# Patient Record
Sex: Female | Born: 1970 | Race: White | Hispanic: No | Smoking: Never smoker
Health system: Southern US, Community
[De-identification: ages and names within clinical notes are randomized; demographics above are authoritative.]

## PROBLEM LIST (undated history)

## (undated) DIAGNOSIS — J45909 Unspecified asthma, uncomplicated: Secondary | ICD-10-CM

---

## 2009-07-18 ENCOUNTER — Ambulatory Visit: Payer: Self-pay | Admitting: Family Medicine

## 2009-07-18 DIAGNOSIS — J069 Acute upper respiratory infection, unspecified: Secondary | ICD-10-CM | POA: Insufficient documentation

## 2010-06-29 NOTE — Assessment & Plan Note (Signed)
Summary: Cough-dry, eye pain, headache x 3 dys rm 3   Vital Signs:  Patient Profile:   40 Years Old Female CC:      Cold & URI symptoms Height:     63 inches Weight:      141 pounds O2 Sat:      100 % O2 treatment:    Room Air Temp:     98.5 degrees F oral Pulse rate:   85 / minute Pulse rhythm:   regular Resp:     16 per minute BP sitting:   104 / 74  (right arm) Cuff size:   regular  Vitals Entered By: Areta Haber CMA (July 18, 2009 4:53 PM)                  Current Allergies: No known allergies History of Present Illness History from: patient Chief Complaint: Cold & URI symptoms History of Present Illness: otherwise healthy female here c/o 3 days h/o cough and congestion she is a Engineer, site. Feels ear pressure. Has not had the flu vaccine this year. No fever. Feels general malaise and decreased energy. No chest pain or difficulty breathing. Mother was diagnosed with Flu last week.  Current Problems: URI (ICD-465.9)   Current Meds SEASONALE 0.15-0.03 MG TABS (LEVONORGEST-ETH ESTRAD 91-DAY) 1 tab by mouth once daily VICKS NYQUIL MULTI-SYMPTOM 15-6.25-325 MG CAPS (DM-DOXYLAMINE-ACETAMINOPHEN) As directed TUSSIN DM 100-10 MG/5ML SYRP (DEXTROMETHORPHAN-GUAIFENESIN) As directed MUCINEX D 60-600 MG XR12H-TAB (PSEUDOEPHEDRINE-GUAIFENESIN) As directed  REVIEW OF SYSTEMS Constitutional Symptoms       Complains of fever.     Denies chills, night sweats, weight loss, weight gain, and fatigue.  Eyes       Complains of eye pain.      Denies change in vision, eye discharge, glasses, contact lenses, and eye surgery. Ear/Nose/Throat/Mouth       Complains of sinus problems.      Denies hearing loss/aids, change in hearing, ear pain, ear discharge, dizziness, frequent runny nose, frequent nose bleeds, sore throat, hoarseness, and tooth pain or bleeding.      Comments: ears stopped up x 3dys Respiratory       Complains of dry cough.      Denies productive cough,  wheezing, shortness of breath, asthma, bronchitis, and emphysema/COPD.  Cardiovascular       Denies murmurs, chest pain, and tires easily with exhertion.    Gastrointestinal       Denies stomach pain, nausea/vomiting, diarrhea, constipation, blood in bowel movements, and indigestion. Genitourniary       Denies painful urination, kidney stones, and loss of urinary control. Neurological       Complains of headaches.      Denies paralysis, seizures, and fainting/blackouts. Musculoskeletal       Denies muscle pain, joint pain, joint stiffness, decreased range of motion, redness, swelling, muscle weakness, and gout.  Skin       Denies bruising, unusual mles/lumps or sores, and hair/skin or nail changes.  Psych       Denies mood changes, temper/anger issues, anxiety/stress, speech problems, depression, and sleep problems. Other Comments: Pt has not seen PCP for this.   Past History:  Past Medical History: Unremarkable  Past Surgical History: Tonsillectomy  Family History: Family History Other cancer CVA Family History Hypertension  Social History: Single Never Smoked Alcohol use-yes - 1-2 weekly Drug use-no Regular exercise-no Smoking Status:  never Drug Use:  no Does Patient Exercise:  no Physical Exam General appearance: well developed, well  nourished, no acute distress Head: normocephalic, atraumatic Eyes: conjunctivae and lids normal Pupils: equal, round, reactive to light Ears: Normal TM's Nasal: Nasal congestion with clear rhinorrhea. Oral/Pharynx: tongue normal, posterior pharynx without erythema or exudate. Neck: No enlarged or tender lymphnodes. Chest/Lungs: no rales, wheezes, or rhonchi bilateral, breath sounds equal without effort Heart: regular rate and  rhythm, no murmur Assessment New Problems: URI (ICD-465.9)  Likely viral. Afebrile. Discussed symptomatic treatment. As per patient request a handed a paper prescription for tamiflu she will take 75mg   two times a day for 5 days in the event she starts developing fever.  Plan New Orders: New Patient Level III [99203]  The patient and/or caregiver has been counseled thoroughly with regard to medications prescribed including dosage, schedule, interactions, rationale for use, and possible side effects and they verbalize understanding.  Diagnoses and expected course of recovery discussed and will return if not improved as expected or if the condition worsens. Patient and/or caregiver verbalized understanding.   Patient Instructions: 1)  Use a decongestant like claritin-D or zyrtec-D twice a day. 2)  Continue tussin or mucinex for cough. 3)  Use nasal saline spray (over the counter) at least 3 times a day. 4)  Steam water (vaporizer) overnight. 5)  Take ibuprofen every 8 hours or tylenol every 6 hours as needed for pain or fever. 6)  Get plenty of rest, drink lots of clear liquids, and use Tylenol or Ibuprofen for fever and comfort. Return in 7-10 days if you're not better: sooner if you'er feeling worse or if you develop difficulty breathing, chest pain.

## 2017-09-02 ENCOUNTER — Emergency Department (INDEPENDENT_AMBULATORY_CARE_PROVIDER_SITE_OTHER): Payer: BC Managed Care – PPO

## 2017-09-02 ENCOUNTER — Emergency Department
Admission: EM | Admit: 2017-09-02 | Discharge: 2017-09-02 | Disposition: A | Discharge status: Home / Self Care | Payer: BC Managed Care – PPO | Source: Home / Self Care | Attending: Family Medicine | Admitting: Family Medicine

## 2017-09-02 DIAGNOSIS — S6992XA Unspecified injury of left wrist, hand and finger(s), initial encounter: Secondary | ICD-10-CM

## 2017-09-02 DIAGNOSIS — S63618A Unspecified sprain of other finger, initial encounter: Secondary | ICD-10-CM
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
## 2017-09-02 NOTE — Discharge Instructions (Addendum)
Apply ice to finger once or twice daily when swelling occurs.  May continue Aleve twice daily.  Begin range of motion and stretching exercises.  "Buddy tape" finger when there is a possibility of injury (sports, biking, etc).
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
## 2021-05-27 ENCOUNTER — Other Ambulatory Visit: Payer: Self-pay

## 2021-05-27 ENCOUNTER — Emergency Department
Admission: RE | Admit: 2021-05-27 | Discharge: 2021-05-27 | Disposition: A | Discharge status: Home / Self Care | Payer: BC Managed Care – PPO | Source: Ambulatory Visit

## 2021-05-27 VITALS — BP 107/77 | HR 92 | Temp 98.7°F | Resp 16

## 2021-05-27 DIAGNOSIS — R051 Acute cough: Secondary | ICD-10-CM | POA: Diagnosis not present

## 2021-05-27 DIAGNOSIS — J329 Chronic sinusitis, unspecified: Secondary | ICD-10-CM

## 2021-05-27 DIAGNOSIS — J40 Bronchitis, not specified as acute or chronic: Secondary | ICD-10-CM | POA: Diagnosis not present

## 2021-05-27 HISTORY — DX: Unspecified asthma, uncomplicated: J45.909

## 2021-05-27 MED ORDER — AMOXICILLIN-POT CLAVULANATE 875-125 MG PO TABS: 1.0000 | ORAL_TABLET | Freq: Two times a day (BID) | ORAL | 0 refills | Status: AC

## 2021-05-27 MED ORDER — PROMETHAZINE-DM 6.25-15 MG/5ML PO SYRP: 5.0000 mL | ORAL_SOLUTION | Freq: Three times a day (TID) | ORAL | 0 refills | Status: AC | PRN

## 2021-05-27 NOTE — ED Provider Notes (Signed)
[facility]    CSN: [id number] Arrival date & time: 05/27/21  1658      History   Chief Complaint Chief Complaint  Patient presents with   Cough    HPI [facility] is a 50 y.o. female.   Patient presents today with a weeklong history of URI symptoms.  Reports dry cough initially that has become productive, nasal congestion, sinus pressure.  She denies any significant shortness of breath, chest pain, fever, body aches, nausea, vomiting.  She does have a history of asthma and is concerned that her asthma might be flared.  She was recently treated for a toe injury and so is completing course of prednisone with last dose of 10 mg tomorrow.  She is use albuterol inhaler 1 time symptoms began but is using Advair as prescribed and Mucinex as needed.  She has had COVID in the past with last episode approximately a year ago.  She denies history of smoking.  She does have allergies and uses over-the-counter medication as needed for symptom relief.  She denies any recent antibiotic use.   Past Medical History:  Diagnosis Date   Asthma     Patient Active Problem List   Diagnosis Date Noted   URI 07/18/2009    History reviewed. No pertinent surgical history.  OB History   No obstetric history on file.      Home Medications    Prior to Admission medications   Medication Sig Start Date End Date Taking? Authorizing Provider  albuterol (VENTOLIN HFA) 108 (90 Base) MCG/ACT inhaler Inhale 2 puffs into the lungs every 6 (six) hours as needed. 11/29/20  Yes [provider]  amoxicillin-clavulanate (AUGMENTIN) 875-125 MG tablet Take 1 tablet by mouth every 12 (twelve) hours. 05/27/21  Yes Corey Laski K, PA-C  fluticasone-salmeterol (ADVAIR) 250-50 MCG/ACT AEPB Inhale into the lungs. 11/08/20 11/08/21 Yes [provider]  JOLESSA 0.15-0.03 MG tablet Take 1 tablet by mouth daily. 07/18/17  Yes [provider]  predniSONE (STERAPRED UNI-PAK 21 TAB) 10 MG  (21) TBPK tablet See admin instructions. follow package directions 05/21/21  Yes [provider]  promethazine-dextromethorphan (PROMETHAZINE-DM) 6.25-15 MG/5ML syrup Take 5 mLs by mouth 3 (three) times daily as needed for cough. 05/27/21  Yes Tatum Corl, Noberto Retort, PA-C    Family History History reviewed. No pertinent family history.  Social History Social History   Tobacco Use   Smoking status: Never   Smokeless tobacco: Never  Substance Use Topics   Alcohol use: Not Currently   Drug use: Not Currently     Allergies   Patient has no known allergies.   Review of Systems Review of Systems  Constitutional:  Positive for activity change and fatigue. Negative for appetite change and fever.  HENT:  Positive for congestion, postnasal drip, sinus pressure and sore throat. Negative for sneezing.   Respiratory:  Positive for cough. Negative for shortness of breath.   Cardiovascular:  Negative for chest pain.  Gastrointestinal:  Negative for abdominal pain, diarrhea, nausea and vomiting.  Musculoskeletal:  Negative for arthralgias and myalgias.  Neurological:  Positive for headaches. Negative for dizziness and light-headedness.    Physical Exam Triage Vital Signs ED Triage Vitals  Enc Vitals Group     BP 05/27/21 1744 107/77     Pulse Rate 05/27/21 1744 92     Resp 05/27/21 1744 16     Temp 05/27/21 1744 98.7 F (37.1 C)     Temp Source 05/27/21 1744 Oral  SpO2 05/27/21 1744 97 %     Weight --      Height --      Head Circumference --      Peak Flow --      Pain Score 05/27/21 1742 0     Pain Loc --      Pain Edu? --      Excl. in GC? --    No data found.  Updated Vital Signs BP 107/77 (BP Location: Right Arm)    Pulse 92    Temp 98.7 F (37.1 C) (Oral)    Resp 16    SpO2 97%   Visual Acuity Right Eye Distance:   Left Eye Distance:   Bilateral Distance:    Right Eye Near:   Left Eye Near:    Bilateral Near:     Physical Exam Vitals reviewed.   Constitutional:      General: She is awake. She is not in acute distress.    Appearance: Normal appearance. She is well-developed. She is not ill-appearing.     Comments: Very pleasant female appears stated age in no acute distress sitting comfortably in exam room  HENT:     Head: Normocephalic and atraumatic.     Right Ear: Tympanic membrane, ear canal and external ear normal. Tympanic membrane is not erythematous or bulging.     Left Ear: Tympanic membrane, ear canal and external ear normal. Tympanic membrane is not erythematous or bulging.     Nose:     Right Sinus: Maxillary sinus tenderness and frontal sinus tenderness present.     Left Sinus: Maxillary sinus tenderness and frontal sinus tenderness present.     Mouth/Throat:     Pharynx: Uvula midline. Posterior oropharyngeal erythema present. No oropharyngeal exudate.     Comments: Significant erythema and drainage in posterior oropharynx Cardiovascular:     Rate and Rhythm: Normal rate and regular rhythm.     Heart sounds: Normal heart sounds, S1 normal and S2 normal. No murmur heard. Pulmonary:     Effort: Pulmonary effort is normal.     Breath sounds: Examination of the left-upper field reveals wheezing. Wheezing present. No rhonchi or rales.     Comments: Scattered wheezes in left upper lung field otherwise no adventitious lung sounds Psychiatric:        Behavior: Behavior is cooperative.     UC Treatments / Results  Labs (all labs ordered are listed, but only abnormal results are displayed) Labs Reviewed - No data to display  EKG   Radiology No results found.  Procedures Procedures (including critical care time)  Medications Ordered in UC Medications - No data to display  Initial Impression / Assessment and Plan / UC Course  I have reviewed the triage vital signs and the nursing notes.  Pertinent labs & imaging results that were available during my care of the patient were reviewed by me and considered in my  medical decision making (see chart for details).     No indication for viral testing given patient has been symptomatic for approximately a week and this would not change management.  Concern for sinobronchitis given clinical presentation.  We will start Augmentin twice daily for 7 days.  She was given Promethazine DM for cough with instruction not to drive or drink alcohol with this medication as drowsiness is a common side effect.  Patient recently completed course of prednisone so we will defer additional steroid use at this time.  She was encouraged to continue  prescribed medication including Advair and use over-the-counter medication such as Mucinex and Flonase for additional symptom relief.  Discussed that if she is having to use her albuterol inhaler frequently or having ongoing wheezing/shortness of breath she needs to be reevaluated.  Discussed alarm symptoms that warrant emergent evaluation including high fever not responding to medication, chest pain, shortness of breath, nausea/vomiting, regular use of albuterol.  Strict return precautions given to which she expressed understanding.  Final Clinical Impressions(s) / UC Diagnoses   Final diagnoses:  Sinobronchitis  Acute cough     Discharge Instructions      Take Augmentin twice daily for 7 days to cover for sinus and bronchitis infection.  Continue using your Advair as prescribed and use albuterol as needed.  If you are having to use your albuterol frequently or continue to have severe shortness of breath/coughing fits you need to be seen again.  Use Promethazine DM for cough.  This can make you sleepy so do not drive or drink alcohol while taking it.  Use Flonase and Mucinex for symptom relief.  It is important to use backup birth control while on antibiotics as it can interfere with your birth control pills.  If you have any worsening symptoms including high fever not responding to medication, cough, shortness of breath, regular use of  albuterol you need to be seen immediately.  If symptoms or not improving please follow-up with your PCP or our clinic in 1 week.     ED Prescriptions     Medication Sig Dispense Auth. Provider   amoxicillin-clavulanate (AUGMENTIN) 875-125 MG tablet Take 1 tablet by mouth every 12 (twelve) hours. 14 tablet Fredda Clarida K, PA-C   promethazine-dextromethorphan (PROMETHAZINE-DM) 6.25-15 MG/5ML syrup Take 5 mLs by mouth 3 (three) times daily as needed for cough. 118 mL Mayre Bury K, PA-C      PDMP not reviewed this encounter.   Jeani Hawking, PA-C 05/27/21 1804

## 2021-05-27 NOTE — ED Triage Notes (Addendum)
Patient presents to Urgent Care with complaints of cough since 6 days. Patient reports started with dry cough, runny nose and does have asthma. She is using Mucinex, Advair and Albuterol inhaler. Denies any headache, fever or chills. Symptoms are worst at night.

## 2021-05-27 NOTE — Discharge Instructions (Addendum)
Take Augmentin twice daily for 7 days to cover for sinus and bronchitis infection.  Continue using your Advair as prescribed and use albuterol as needed.  If you are having to use your albuterol frequently or continue to have severe shortness of breath/coughing fits you need to be seen again.  Use Promethazine DM for cough.  This can make you sleepy so do not drive or drink alcohol while taking it.  Use Flonase and Mucinex for symptom relief.  It is important to use backup birth control while on antibiotics as it can interfere with your birth control pills.  If you have any worsening symptoms including high fever not responding to medication, cough, shortness of breath, regular use of albuterol you need to be seen immediately.  If symptoms or not improving please follow-up with your PCP or our clinic in 1 week.
